# Patient Record
Sex: Female | Born: 1935 | Race: White | Hispanic: No | Marital: Married
Health system: Southern US, Community
[De-identification: ages and names within clinical notes are randomized; demographics above are authoritative.]

---

## 2018-03-07 ENCOUNTER — Emergency Department
Admission: EM | Admit: 2018-03-07 | Discharge: 2018-03-07 | Disposition: A | Discharge status: Home / Self Care | Payer: Self-pay | Attending: Emergency Medicine | Admitting: Emergency Medicine

## 2018-03-07 ENCOUNTER — Emergency Department: Payer: Self-pay

## 2018-03-07 ENCOUNTER — Other Ambulatory Visit: Payer: Self-pay

## 2018-03-07 DIAGNOSIS — Y33XXXA Other specified events, undetermined intent, initial encounter: Secondary | ICD-10-CM | POA: Insufficient documentation

## 2018-03-07 DIAGNOSIS — Y9389 Activity, other specified: Secondary | ICD-10-CM | POA: Insufficient documentation

## 2018-03-07 DIAGNOSIS — Y929 Unspecified place or not applicable: Secondary | ICD-10-CM | POA: Insufficient documentation

## 2018-03-07 DIAGNOSIS — Y999 Unspecified external cause status: Secondary | ICD-10-CM | POA: Insufficient documentation

## 2018-03-07 DIAGNOSIS — S73004A Unspecified dislocation of right hip, initial encounter: Secondary | ICD-10-CM | POA: Insufficient documentation

## 2018-03-07 MED ORDER — ETOMIDATE 2 MG/ML IV SOLN
10.0000 mg | Freq: Once | INTRAVENOUS | Status: AC
  Administered 2018-03-07: 10 mg via INTRAVENOUS
  Filled 2018-03-07: qty 10

## 2018-03-07 MED ORDER — ONDANSETRON HCL 4 MG/2ML IJ SOLN
INTRAMUSCULAR | Status: AC
  Administered 2018-03-07: 4 mg
  Filled 2018-03-07: qty 2

## 2018-03-07 NOTE — ED Notes (Signed)
This Rn received call from Daughter in law in reference to status of pt. RN confirmed with pt at this time permission to speak with daughter in law.

## 2018-03-07 NOTE — ED Notes (Signed)
Patient transported to X-ray 

## 2018-03-07 NOTE — ED Notes (Signed)
EKG post moderation preformed.

## 2018-03-07 NOTE — ED Provider Notes (Signed)
[facility] Emergency Department Provider Note ____________________________________________   First MD Initiated Contact with Patient 03/07/18 1046     (approximate)  I have reviewed the triage vital signs and the nursing notes.   HISTORY  Chief Complaint Fall  HPI Kris HartmannMary Covell is a 84 y.o. female with a history of right-sided hip with dislocation was presented to emergency department right-sided hip pain.  She was sitting on the toilet when she had sudden onset of her hip pain.  EMS was called and gave her 50 of fentanyl.  Patient has been placed in a position of comfort which is her right leg being internally rotated.  She says that she has pain with movement.  Pain is decreased when she is still.  Neurovascularly intact with sensation as well as strength distal to the site of the pain.  No trauma reported.  Patient says that she does not think she had anything to eat this morning but is not able to fully remember.  No past medical history on file.  There are no active problems to display for this patient.     Prior to Admission medications   Not on File    Allergies Patient has no allergy information on record.  No family history on file.  Social History Social History   Tobacco Use  . Smoking status: Not on file  Substance Use Topics  . Alcohol use: Not on file  . Drug use: Not on file    Review of Systems  Constitutional: No fever/chills Eyes: No visual changes. ENT: No sore throat. Cardiovascular: Denies chest pain. Respiratory: Denies shortness of breath. Gastrointestinal: No abdominal pain.  No nausea, no vomiting.  No diarrhea.  No constipation. Genitourinary: Negative for dysuria. Musculoskeletal: Negative for back pain. Skin: Negative for rash. Neurological: Negative for headaches, focal weakness or numbness.   ____________________________________________   PHYSICAL EXAM:  VITAL SIGNS: ED Triage Vitals [03/07/18 1054]    Enc Vitals Group     BP (!) 121/46     Pulse      Resp      Temp 97.8 F (36.6 C)     Temp Source Oral     SpO2 99 %     Weight 150 lb (68 kg)     Height 5\' 7"  (1.702 m)     Head Circumference      Peak Flow      Pain Score 7     Pain Loc      Pain Edu?      Excl. in GC?     Constitutional: Alert and oriented. Well appearing and in no acute distress. Eyes: Conjunctivae are normal.  Head: Atraumatic. Nose: No congestion/rhinnorhea. Mouth/Throat: Mucous membranes are moist.  Neck: No stridor.   Cardiovascular: Normal rate, regular rhythm. Grossly normal heart sounds.  Good peripheral circulation with equal and bilateral posterior tibial pulses.   Respiratory: Normal respiratory effort.  No retractions. Lungs CTAB. Gastrointestinal: Soft and nontender. No distention. No CVA tenderness. Musculoskeletal: Right leg held in a position of comfort with internal rotation.  Flexed at the right knee.  5 out of 5 strength distal to the site of the pain with sensation that is intact to light touch.  Pulses intact. Neurologic:  Normal speech and language. No gross focal neurologic deficits are appreciated. Skin:  Skin is warm, dry and intact. No rash noted. Psychiatric: Mood and affect are normal. Speech and behavior are normal.  ____________________________________________   LABS (all labs  ordered are listed, but only abnormal results are displayed)  Labs Reviewed - No data to display ____________________________________________  EKG  ED ECG REPORT I, Arelia Longest, the attending physician, personally viewed and interpreted this ECG.   Date: 03/07/2018  EKG Time: 1051  Rate: 67  Rhythm: normal sinus rhythm  Axis: Normal  Intervals:none  ST&T Change: No ST segment elevation or depression.  Single T wave inversion in aVL.  Wandering baseline likely confounding read.  ____________________________________________  RADIOLOGY  Posterior and superior dislocation of the  femoral head component of the right total hip prosthesis  Right hip with successful reduction. ____________________________________________   PROCEDURES  Procedure(s) performed:    .Sedation Date/Time: 03/07/2018 11:58 AM Performed by: Myrna Blazer, MD Authorized by: Myrna Blazer, MD   Consent:    Consent obtained:  Written (electronic informed consent)   Consent given by:  Patient   Risks discussed:  Allergic reaction, dysrhythmia, inadequate sedation, nausea, vomiting, respiratory compromise necessitating ventilatory assistance and intubation, prolonged sedation necessitating reversal and prolonged hypoxia resulting in organ damage   Alternatives discussed:  Analgesia without sedation and anxiolysis Universal protocol:    Procedure explained and questions answered to patient or proxy's satisfaction: yes     Relevant documents present and verified: yes     Test results available and properly labeled: yes     Imaging studies available: yes     Required blood products, implants, devices, and special equipment available: yes     Immediately prior to procedure a time out was called: yes     Patient identity confirmation method:  Arm band Indications:    Procedure performed:  Dislocation reduction   Procedure necessitating sedation performed by:  Physician performing sedation   Intended level of sedation:  Moderate (conscious sedation) Pre-sedation assessment:    Time since last food or drink:  >12hr   ASA classification: class 1 - normal, healthy patient     Neck mobility: normal     Mouth opening:  3 or more finger widths   Mallampati score:  III - soft palate, base of uvula visible   Pre-sedation assessments completed and reviewed: airway patency, cardiovascular function, hydration status, mental status, nausea/vomiting, pain level, respiratory function and temperature     Pre-sedation assessment completed:  03/07/2018 11:59 AM Immediate pre-procedure  details:    Reassessment: Patient reassessed immediately prior to procedure     Reviewed: vital signs, relevant labs/tests and NPO status     Verified: bag valve mask available, emergency equipment available, intubation equipment available, IV patency confirmed, oxygen available, reversal medications available and suction available   Procedure details (see MAR for exact dosages):    Preoxygenation:  Nasal cannula   Sedation:  Etomidate   Intra-procedure monitoring:  Blood pressure monitoring, continuous pulse oximetry, cardiac monitor, frequent vital sign checks, frequent LOC assessments and continuous capnometry   Intra-procedure events comment:  Transient bradycardia for about 20sec to 29bpm.  gagging.  no emesis.   Intra-procedure management:  Supplemental oxygen (resolved spontaneously.  )   Total Provider sedation time (minutes):  6 Post-procedure details:    Post-sedation assessment completed:  03/07/2018 1:02 PM   Attendance: Constant attendance by certified staff until patient recovered     Recovery: Patient returned to pre-procedure baseline     Post-sedation assessments completed and reviewed: airway patency, cardiovascular function, hydration status, mental status, nausea/vomiting and respiratory function     Patient is stable for discharge or admission: yes  Patient tolerance:  Tolerated well, no immediate complications Comments:     Pt nauseous s/p procedure, but given zofran.  Will be dc'd with right sided knee immobilizer.  Pt to f/u with orthopedics.    Reduction of dislocation Date/Time: 03/07/2018 1:16 PM Performed by: Myrna Blazer, MD Authorized by: Myrna Blazer, MD  Consent: Written consent obtained. Consent given by: patient Patient understanding: patient states understanding of the procedure being performed Patient consent: the patient's understanding of the procedure matches consent given Procedure consent: procedure consent matches  procedure scheduled Relevant documents: relevant documents present and verified Test results: test results available and properly labeled Imaging studies: imaging studies available Patient identity confirmed: verbally with patient, arm band and provided demographic data Time out: Immediately prior to procedure a "time out" was called to verify the correct patient, procedure, equipment, support staff and site/side marked as required. Local anesthesia used: no  Anesthesia: Local anesthesia used: no  Sedation: Patient sedated: yes Sedation type: moderate (conscious) sedation Sedatives: etomidate  Patient tolerance: Patient tolerated the procedure well with no immediate complications Comments: Patient supine.  Sedated.  Knee flexed with a small amount of internal rotation.  Traction pulled by me at the knee with tach holding the patient's pelvis in place.  Felt the prosthetic moved back in the place and smooth range of motion of the hip thereafter.  Able to straighten the leg.  After patient came out of sedation she says that she does not have any pain to the right hip anymore.  She was placed in the immobilizer and she is neurovascularly intact with an intact dorsalis pedis pulse distal to the site of the immobilizer.     Critical Care performed:   ____________________________________________   INITIAL IMPRESSION / ASSESSMENT AND PLAN / ED COURSE  Pertinent labs & imaging results that were available during my care of the patient were reviewed by me and considered in my medical decision making (see chart for details).  DDX: Hip fracture, hip dislocation, arthritis, bursitis, septic joint As part of my medical decision making, I reviewed the following data within the electronic MEDICAL RECORD NUMBERNo previous records on file for review  ----------------------------------------- 1:18 PM on 03/07/2018 -----------------------------------------  I discussed the case with the patient as well  as the patient's daughter-in-law, Herbert Seta.  With the patient's daughter-in-law I did discuss the complication of the gagging during the procedure.  She states that the patient usually vomits on the way home from the hospital.  Patient saying that she does feel nauseous at this time.  Given Zofran.  Back to baseline mental status.  Family on the way to pick up the patient.  We will give her orthopedic follow-up.  Patient to wear knee immobilizer.  She is understanding of the diagnosis as well as treatment plan willing to comply. ____________________________________________   FINAL CLINICAL IMPRESSION(S) / ED DIAGNOSES  Right hip dislocation  NEW MEDICATIONS STARTED DURING THIS VISIT:  New Prescriptions   No medications on file     Note:  This document was prepared using Dragon voice recognition software and may include unintentional dictation errors.     Myrna Blazer, MD 03/07/18 1318

## 2018-03-07 NOTE — ED Notes (Signed)
Daughter in law called in reference to a update on pt status,  Update given daughter in law is on the phone with EDP at this time.

## 2018-03-07 NOTE — ED Notes (Signed)
Pt was placed under moderate sedation for R Hip reduction for dislocation. Pt tolerated pt had a vagal episode after reduction was preformed. EDP was at bedside. Pt was suctioned and vagal eisode was corrected. Pt is waking up slowly and at this time responds to name being called and answers questions appropriately.   Informed consent was obtained at 1229 by pt EDP and this RN. Concent is placed on chart at this time.

## 2018-03-07 NOTE — ED Triage Notes (Signed)
Pt presents for hip pain . Pt was on commode and states "somethikng popped" of fent IV Pt had a hip brace presented with EMS. Pt has dementia . EDP at bedside.  Pt pain is 10/10 at this time.

## 2018-03-07 NOTE — ED Notes (Addendum)
Pt labs sent at this time. Type and Screen as well. RN will monitor for the results

## 2018-03-16 NOTE — ED Provider Notes (Signed)
I spoke with the patient's son, Doran Durandom Lingard, who takes care of the patient.  He is asking why the patient was placed in a knee immobilizer.  We discussed that the patient was placed in an immobilizer to reduce the risk of further dislocation.  He says that the patient has a brace was recommended by past orthopedic doctor and that they will be following up with the orthopedic doctor.  He asked if they would be able to change her over to the other brace.  I told the son that I feel very reluctant to make that sort of recommendation over the phone as I am not an orthopedist and I am not there to give an exam.  I would recommend calling the orthopedist office with the patient [facility], which the son said he would rather call and the patient [place].  I agreed with this as this Dr. is familiar with the patient.   Myrna BlazerSchaevitz, Laronda Lisby Matthew, MD 03/16/18 [phone]

## 2019-07-13 IMAGING — DX DG HIP (WITH OR WITHOUT PELVIS) 2-3V*L*
2 series · 2 of 2 positions shown · non-contrast
Comparison: None.

CLINICAL DATA: Post reduction prosthetic dislocation

EXAM:
Hip with pelvis

[pelvis ap]
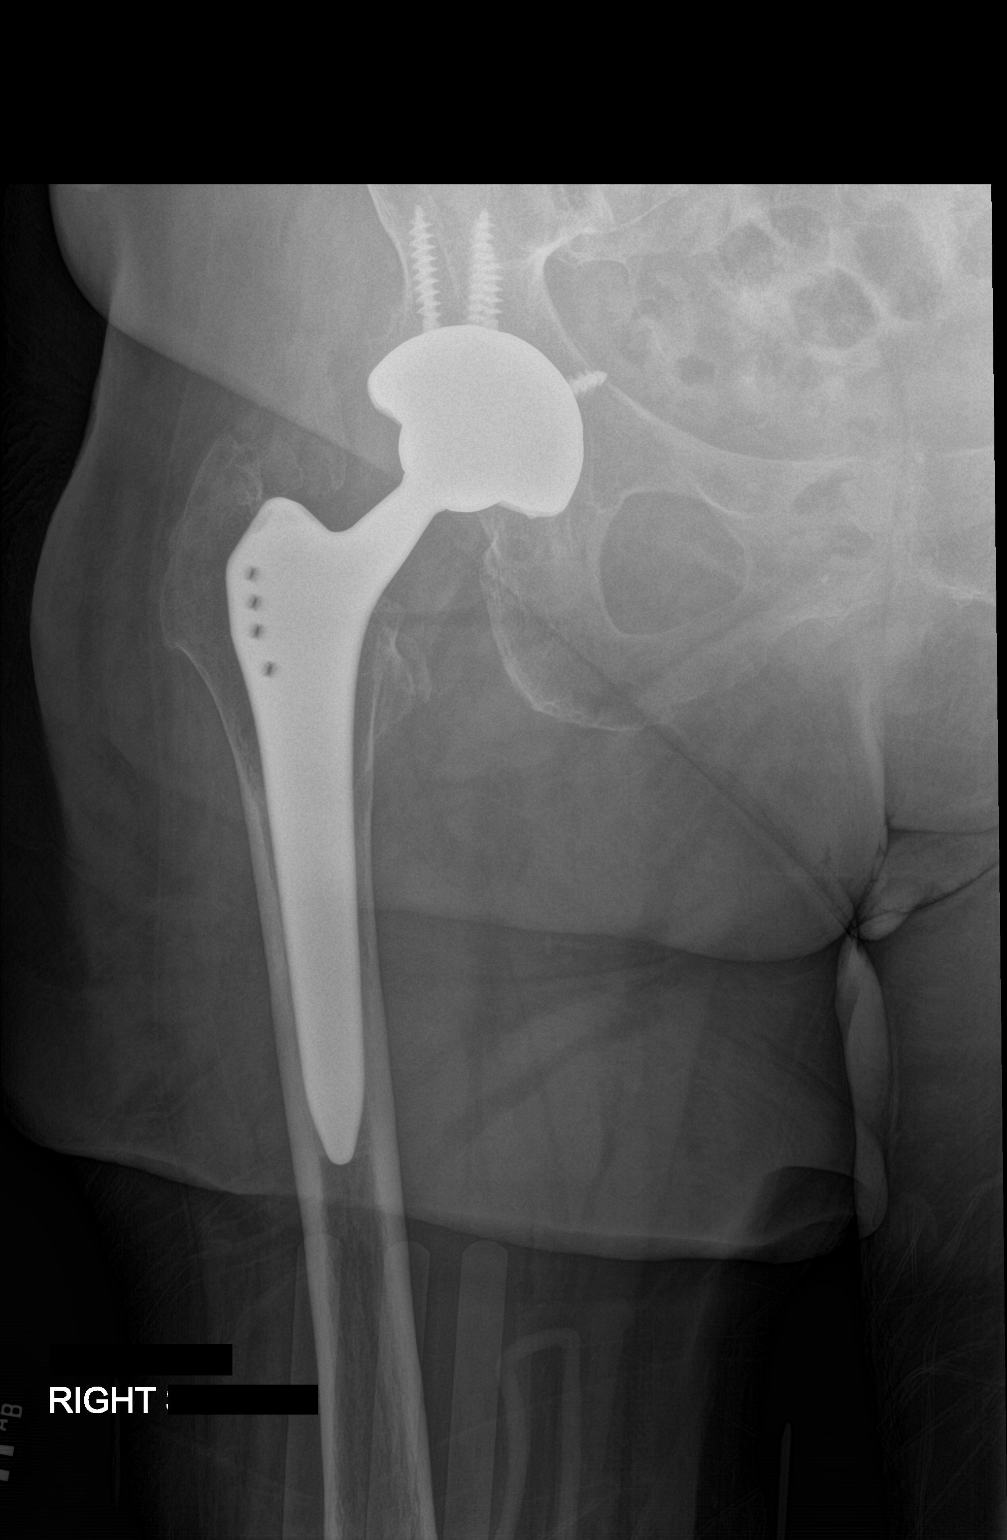

[hip lat]
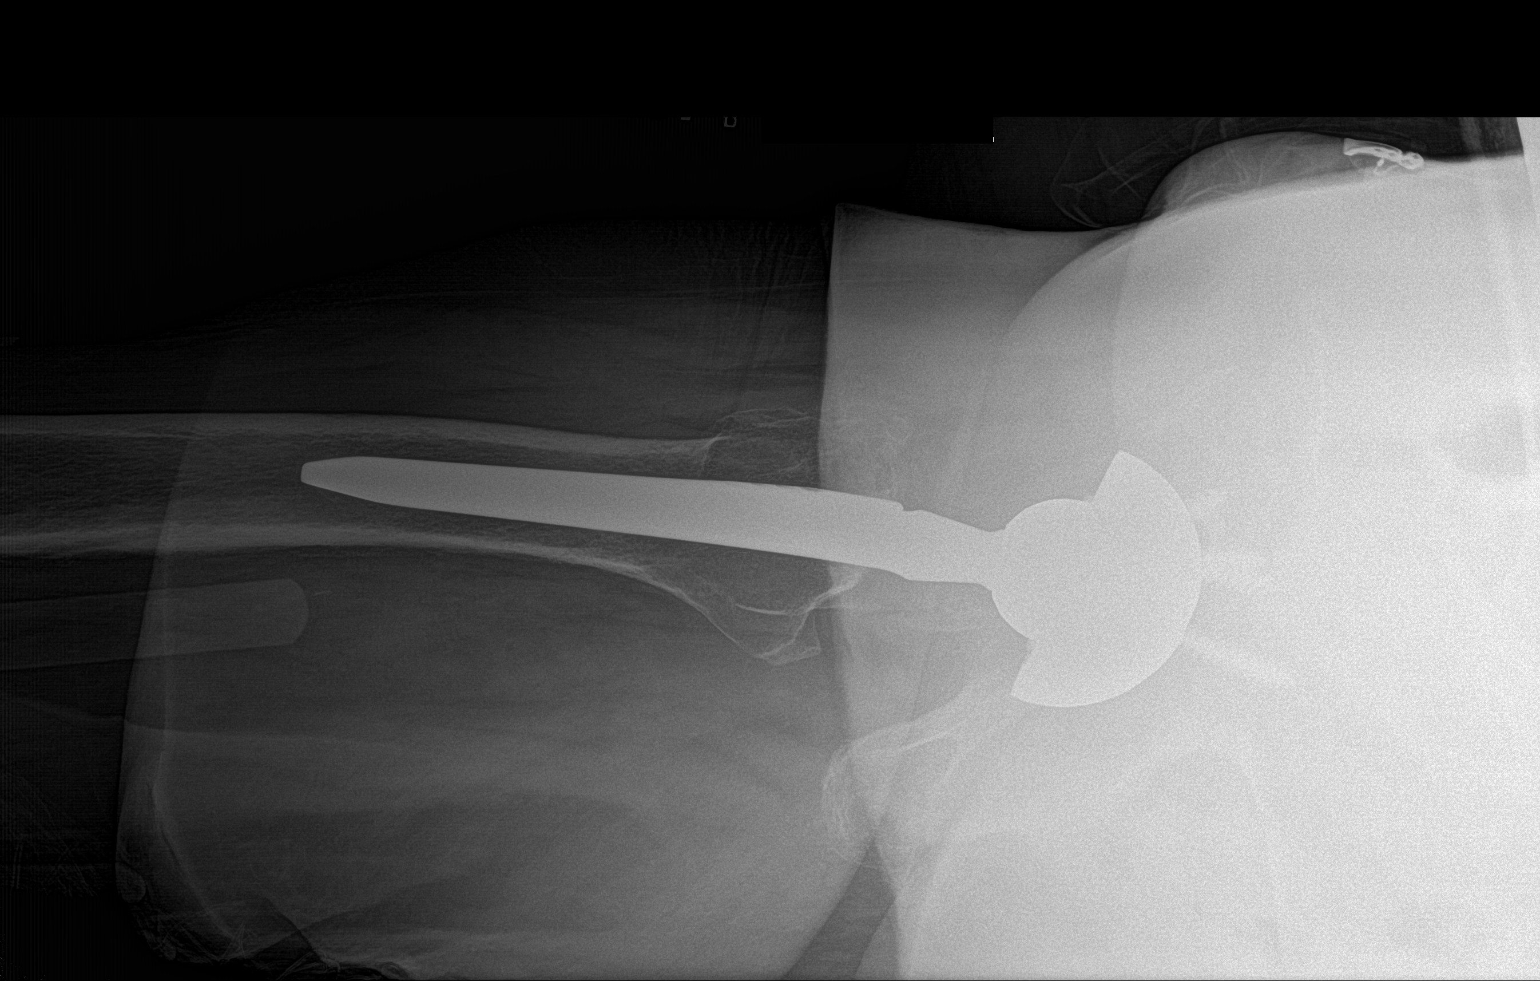

[2 of 2 positions shown; findings below may reference images not displayed]

FINDINGS: Relocation of the femoral prosthetic into the acetabular prosthetic.
IMPRESSION: Reduction RIGHT hip dislocation

## 2019-07-13 IMAGING — CR DG HIP (WITH OR WITHOUT PELVIS) 2-3V*R*
3 series · 3 of 3 positions shown · non-contrast
Comparison: None.

CLINICAL DATA: Right hip pain after feeling a popping sensation
when sitting today.

EXAM:
DG HIP (WITH OR WITHOUT PELVIS) 2-3V RIGHT

[pelvis ap]
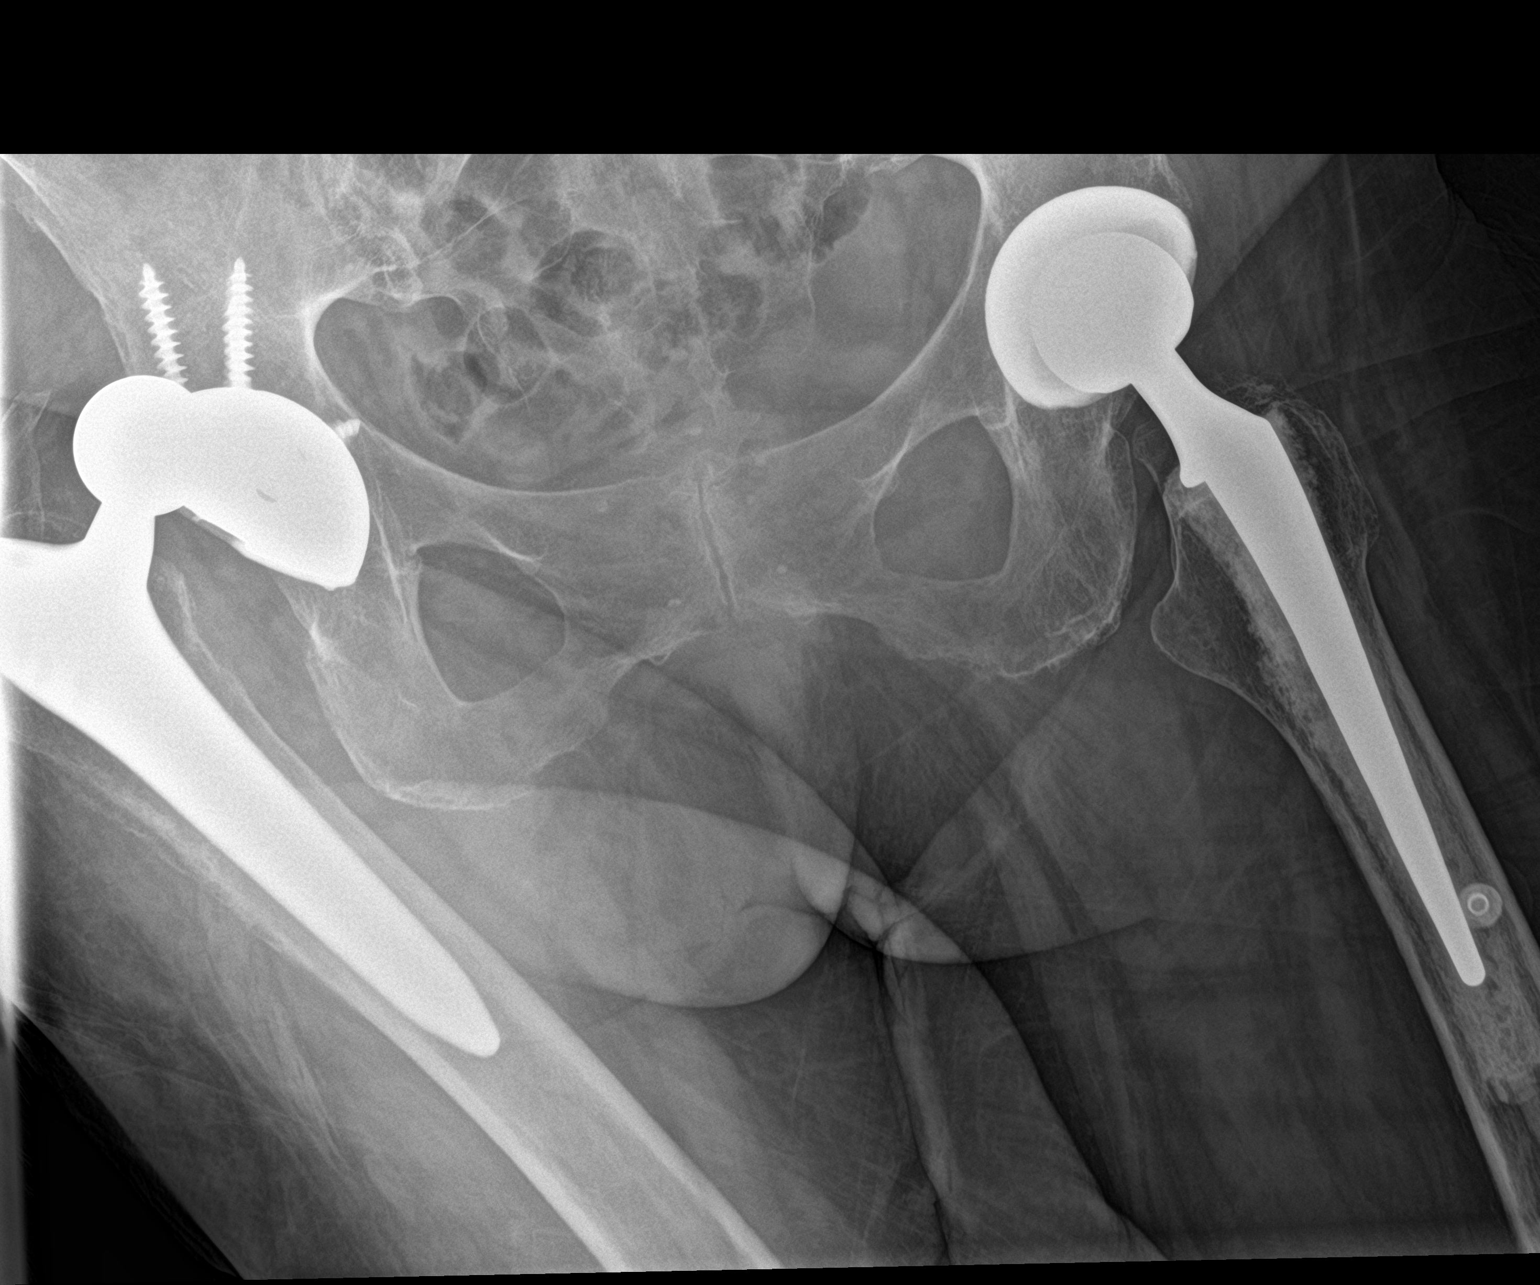

[hip ap]
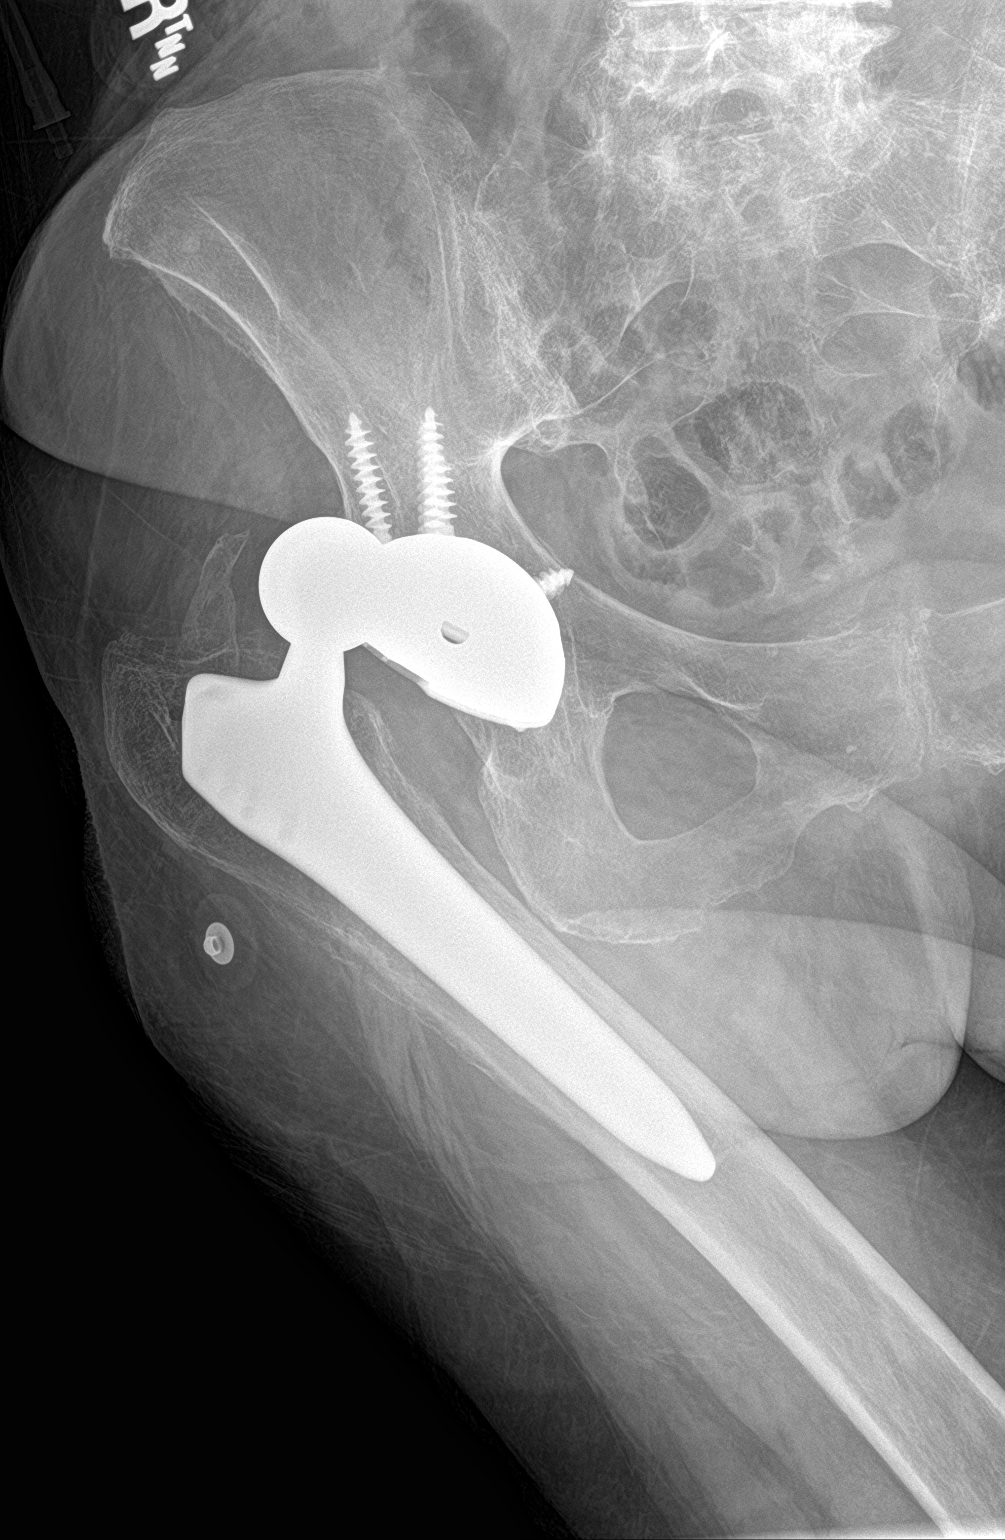

[hip lat]
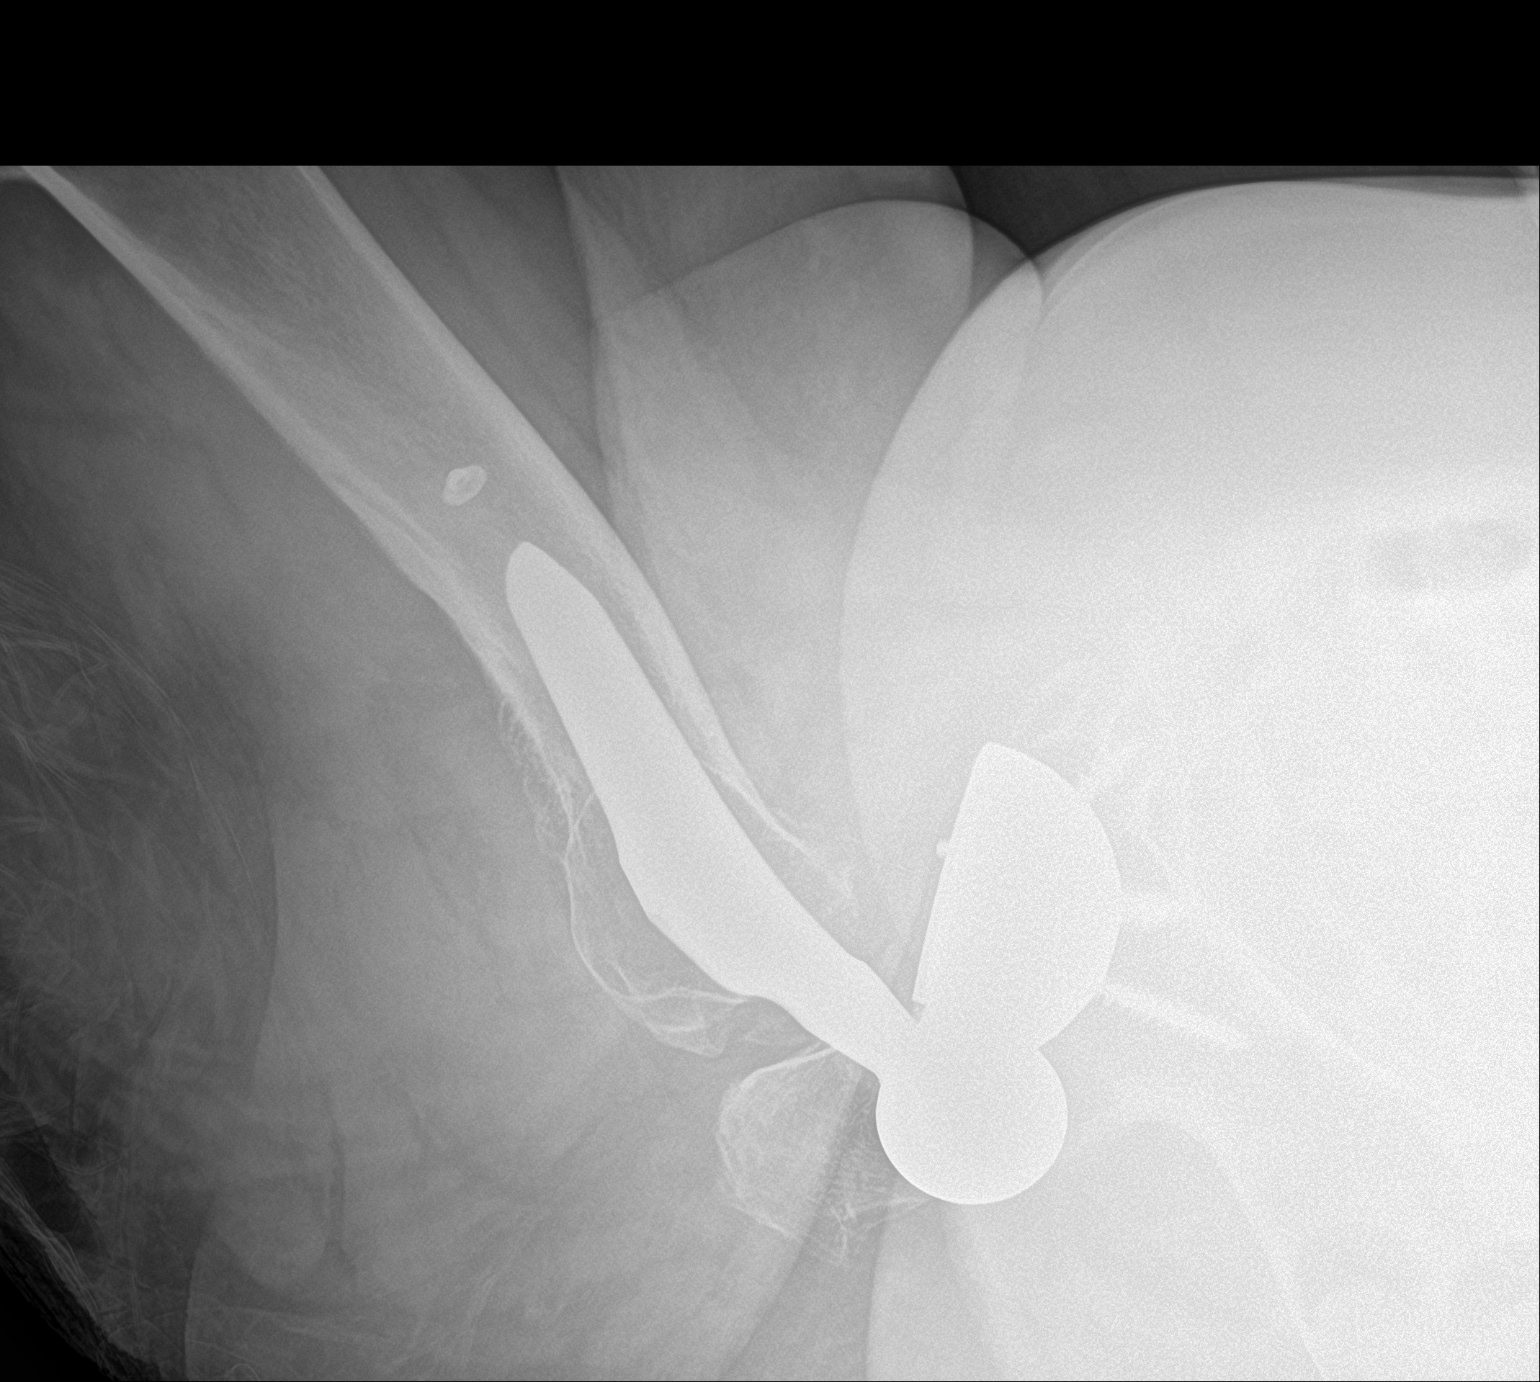

[3 of 3 positions shown; findings below may reference images not displayed]

FINDINGS: Right total hip prosthesis with posterior, superior dislocation of
the femoral head component. Left hip bipolar prosthesis in
satisfactory position and alignment. No fracture seen. Diffuse
osteopenia.
IMPRESSION: Posterior, superior dislocation of the femoral head component of the
right total hip prosthesis.
